# Patient Record
Sex: Male | Born: 1970 | Race: White | Hispanic: No | Marital: Married | Smoking: Former smoker
Health system: Southern US, Community
[De-identification: ages and names within clinical notes are randomized; demographics above are authoritative.]

## PROBLEM LIST (undated history)

## (undated) DIAGNOSIS — R569 Unspecified convulsions: Secondary | ICD-10-CM

## (undated) DIAGNOSIS — I10 Essential (primary) hypertension: Secondary | ICD-10-CM

## (undated) HISTORY — PX: HAND SURGERY: SHX662

---

## 2018-12-09 ENCOUNTER — Encounter: Payer: Self-pay | Admitting: Neurology

## 2018-12-17 ENCOUNTER — Encounter: Payer: Self-pay | Admitting: Neurology

## 2018-12-24 ENCOUNTER — Ambulatory Visit: Payer: Self-pay | Admitting: Neurology

## 2018-12-29 NOTE — Progress Notes (Deleted)
Gwenette Greet was seen today in neurologic consultation at the request of Everlena Cooper, MD.  The consultation is for the evaluation of possible dystonia.  Multiple records have been reviewed.  Patient was seen in Dr. Logan Bores clinic on November 13, 2018 due to concern of possible seizure.  The event, however, occurred in November, 2019.  He apparently slumped over his desk at work and reports that he was apneic and pulseless.  Reports that the event only lasted for a few seconds, but states that he can remember being placed in the EMS and also remembers having tremor throughout his entire body.  Since that event, he states that he has been having tightening of the muscles in the right upper extremity, for which he was referred here for possible dystonia.  He has had similar events in the past.  The events can last minutes to hours in duration.  After the event, the patient feels tired.  The patient admits that he was manipulating his Xanax and alcohol use prior to the November event.  Dr. Logan Bores did a routine EEG in his office that same day and reported that the patient had right arm jerking and dystonic posturing with wrist and right arm flexion that did not correlate with EEG change.  Patient had an MRI of the brain with and without gadolinium on December 01, 2018.  He did not bring the films.  I do have the report.  This was reported to show multiple, small T2 hyperintensities.  It was otherwise unremarkable.  MRI of the cervical spine was done the same day and reported to show moderate spondylosis with degenerative endplate changes and edema.  There was multilevel disc bulging.  There was moderate spinal stenosis, most prominent at C6-C7.  There is moderate to severe neural foraminal stenosis at C6-C7.  Neuroimaging has *** previously been performed.  It *** available for my review today.  PREVIOUS MEDICATIONS: ***  ALLERGIES:  Allergies not on file  CURRENT MEDICATIONS:  Outpatient Encounter  Medications as of 12/30/2018  Medication Sig  . ALPRAZolam (XANAX) 0.5 MG tablet Take 0.5 mg by mouth as needed for anxiety.  . baclofen (LIORESAL) 10 MG tablet Take 10 mg by mouth 3 (three) times daily.  Marland Kitchen lisinopril-hydrochlorothiazide (PRINZIDE,ZESTORETIC) 10-12.5 MG tablet Take 1 tablet by mouth daily.  Marland Kitchen PARoxetine (PAXIL) 20 MG tablet Take 20 mg by mouth daily.   No facility-administered encounter medications on file as of 12/30/2018.     PAST MEDICAL HISTORY:  No past medical history on file.  PAST SURGICAL HISTORY:  *** The histories are not reviewed yet. Please review them in the "History" navigator section and refresh this SmartLink.  SOCIAL HISTORY:   Social History   Socioeconomic History  . Marital status: Not on file    Spouse name: Not on file  . Number of children: Not on file  . Years of education: Not on file  . Highest education level: Not on file  Occupational History  . Not on file  Social Needs  . Financial resource strain: Not on file  . Food insecurity:    Worry: Not on file    Inability: Not on file  . Transportation needs:    Medical: Not on file    Non-medical: Not on file  Tobacco Use  . Smoking status: Not on file  Substance and Sexual Activity  . Alcohol use: Not on file  . Drug use: Not on file  . Sexual activity: Not on  file  Lifestyle  . Physical activity:    Days per week: Not on file    Minutes per session: Not on file  . Stress: Not on file  Relationships  . Social connections:    Talks on phone: Not on file    Gets together: Not on file    Attends religious service: Not on file    Active member of club or organization: Not on file    Attends meetings of clubs or organizations: Not on file    Relationship status: Not on file  . Intimate partner violence:    Fear of current or ex partner: Not on file    Emotionally abused: Not on file    Physically abused: Not on file    Forced sexual activity: Not on file  Other Topics Concern   . Not on file  Social History Narrative  . Not on file    FAMILY HISTORY:   No family status information on file.    ROS:  ROS  PHYSICAL EXAMINATION:    VITALS:  There were no vitals filed for this visit.  GEN:  Normal appears male in no acute distress.  Appears stated age. HEENT:  Normocephalic, atraumatic. The mucous membranes are moist. The superficial temporal arteries are without ropiness or tenderness. Cardiovascular: Regular rate and rhythm. Lungs: Clear to auscultation bilaterally. Neck/Heme: There are no carotid bruits noted bilaterally.  NEUROLOGICAL: Orientation:  The patient is alert and oriented x 3.  Fund of knowledge is appropriate.  Recent and remote memory intact.  Attention span and concentration normal.  Repeats and names without difficulty. Cranial nerves: There is good facial symmetry. The pupils are equal round and reactive to light bilaterally. Fundoscopic exam reveals clear disc margins bilaterally. Extraocular muscles are intact and visual fields are full to confrontational testing. Speech is fluent and clear. Soft palate rises symmetrically and there is no tongue deviation. Hearing is intact to conversational tone. Tone: Tone is good throughout. Sensation: Sensation is intact to light touch and pinprick throughout (facial, trunk, extremities). Vibration is intact at the bilateral big toe. There is no extinction with double simultaneous stimulation. There is no sensory dermatomal level identified. Coordination:  The patient has no difficulty with RAM's or FNF bilaterally. Motor: Strength is 5/5 in the bilateral upper and lower extremities.  Shoulder shrug is equal and symmetric. There is no pronator drift.  There are no fasciculations noted. DTR's: Deep tendon reflexes are 2/4 at the bilateral biceps, triceps, brachioradialis, patella and achilles.  Plantar responses are downgoing bilaterally. Gait and Station: The patient is able to ambulate without  difficulty. The patient is able to heel toe walk without any difficulty. The patient is able to ambulate in a tandem fashion. The patient is able to stand in the Romberg position.   IMPRESSION/PLAN  1. ***Abnormal movements  -It is my feeling that this is doubtful a primary dystonia, although I did not necessarily see this in the office today.  Dystonias can be set off by cervical lesions and he apparently does have moderate spinal canal stenosis at the C6-C7 level (I do not have films of this), so I would first recommend he have this evaluated by neurosurgery.   Cc:  No primary care provider on file.

## 2018-12-30 ENCOUNTER — Ambulatory Visit: Payer: Self-pay | Admitting: Neurology

## 2019-01-13 ENCOUNTER — Ambulatory Visit: Payer: Self-pay | Admitting: Neurology

## 2019-02-16 ENCOUNTER — Ambulatory Visit: Payer: Self-pay | Admitting: Neurology

## 2019-07-22 ENCOUNTER — Encounter (HOSPITAL_BASED_OUTPATIENT_CLINIC_OR_DEPARTMENT_OTHER): Payer: Self-pay | Admitting: Emergency Medicine

## 2019-07-22 ENCOUNTER — Other Ambulatory Visit: Payer: Self-pay

## 2019-07-22 ENCOUNTER — Emergency Department (HOSPITAL_BASED_OUTPATIENT_CLINIC_OR_DEPARTMENT_OTHER)
Admission: EM | Admit: 2019-07-22 | Discharge: 2019-07-22 | Disposition: A | Discharge status: Home / Self Care | Payer: 59 | Attending: Emergency Medicine | Admitting: Emergency Medicine

## 2019-07-22 ENCOUNTER — Emergency Department (HOSPITAL_BASED_OUTPATIENT_CLINIC_OR_DEPARTMENT_OTHER): Payer: 59

## 2019-07-22 DIAGNOSIS — R002 Palpitations: Secondary | ICD-10-CM | POA: Diagnosis not present

## 2019-07-22 DIAGNOSIS — R0789 Other chest pain: Secondary | ICD-10-CM

## 2019-07-22 DIAGNOSIS — R0602 Shortness of breath: Secondary | ICD-10-CM | POA: Diagnosis present

## 2019-07-22 DIAGNOSIS — I10 Essential (primary) hypertension: Secondary | ICD-10-CM | POA: Insufficient documentation

## 2019-07-22 DIAGNOSIS — Z79899 Other long term (current) drug therapy: Secondary | ICD-10-CM | POA: Insufficient documentation

## 2019-07-22 DIAGNOSIS — F17200 Nicotine dependence, unspecified, uncomplicated: Secondary | ICD-10-CM | POA: Diagnosis not present

## 2019-07-22 HISTORY — DX: Essential (primary) hypertension: I10

## 2019-07-22 LAB — BASIC METABOLIC PANEL
Anion gap: 11 (ref 5–15)
BUN: 17 mg/dL (ref 6–20)
CO2: 25 mmol/L (ref 22–32)
Calcium: 10 mg/dL (ref 8.9–10.3)
Chloride: 99 mmol/L (ref 98–111)
Creatinine, Ser: 0.83 mg/dL (ref 0.61–1.24)
GFR calc Af Amer: >60 mL/min (ref >60)
GFR calc non Af Amer: >60 mL/min (ref >60)
Glucose, Bld: 110 mg/dL — ABNORMAL HIGH (ref 70–99)
Potassium: 3.8 mmol/L (ref 3.5–5.1)
Sodium: 135 mmol/L (ref 135–145)

## 2019-07-22 LAB — CBC
HCT: 44.2 % (ref 39.0–52.0)
Hemoglobin: 14.8 g/dL (ref 13.0–17.0)
MCH: 31 pg (ref 26.0–34.0)
MCHC: 33.5 g/dL (ref 30.0–36.0)
MCV: 92.7 fL (ref 80.0–100.0)
Platelets: 301 10*3/uL (ref 150–400)
RBC: 4.77 MIL/uL (ref 4.22–5.81)
RDW: 11.2 % — ABNORMAL LOW (ref 11.5–15.5)
WBC: 6.9 10*3/uL (ref 4.0–10.5)
nRBC: 0 % (ref 0.0–0.2)

## 2019-07-22 LAB — TROPONIN I (HIGH SENSITIVITY)
Troponin I (High Sensitivity): <2 ng/L (ref <18)
Troponin I (High Sensitivity): <2 ng/L (ref <18)

## 2019-07-22 LAB — TSH: TSH: 1.032 u[IU]/mL (ref 0.350–4.500)

## 2019-07-22 MED ORDER — SODIUM CHLORIDE 0.9 % IV BOLUS
1000.0000 mL | Freq: Once | INTRAVENOUS | Status: AC
  Administered 2019-07-22: 14:00:00 1000 mL via INTRAVENOUS

## 2019-07-22 NOTE — ED Notes (Signed)
ED Provider at bedside discussing test results and dispo plan of care. 

## 2019-07-22 NOTE — ED Notes (Signed)
Patient transported to X-ray 

## 2019-07-22 NOTE — Discharge Instructions (Addendum)
You were seen in the emergency department today for chest pain, trouble breathing, & heart racing. Your work-up in the emergency department has been overall reassuring. Your labs have been fairly normal and or similar to previous blood work you have had done. Your EKG and the enzyme we use to check your heart did not show an acute heart attack at this time. Your chest x-ray was normal.   We would like you to follow up closely with your primary care provider within 1-3 days. Return to the ER immediately should you experience any new or worsening symptoms including but not limited to return of pain, worsened pain, vomiting, shortness of breath, dizziness, lightheadedness, passing out, or any other concerns that you may have.

## 2019-07-22 NOTE — ED Triage Notes (Signed)
Pt c/o central chest tightness, with bilateral shoulder pain, sob and dizziness that worsened today. Pt reports similar symptoms last week that resolved.

## 2019-07-22 NOTE — ED Notes (Signed)
Pt ambulated to bathroom 

## 2019-07-22 NOTE — ED Provider Notes (Signed)
[facility] EMERGENCY DEPARTMENT Provider Note   CSN: [id number] Arrival date & time: 07/22/19  1144     History   Chief Complaint Chief Complaint  Patient presents with  . Shortness of Breath    HPI Mitchell Lawson is a 48 y.o. male with a hx of tobacco abuse & HTN who presents to the ED w/ complaints of dyspnea, palpitations, & chest tightness which occurred @ 11:00 this AM and is relatively resolved at this time. Patient states he woke up in his relatively normal state of health other than that he did not sleep well last night.  He states that he was up moving around at work today and when he got back inside and sat down he began to feel lightheaded with palpitations like his heart was racing, short of breath, and his chest became mildly tight.  He did not necessarily think he was going to pass out.  He states the symptoms were very intense initially but have significantly calmed down and almost resolved at this point.  He cannot think of a specific trigger or alleviating or aggravating factor.  He had a similar milder briefer episode about a week ago that prompted him to make a primary care appointment for this upcoming week.  Symptoms did not seem to be exertional or pleuritic.  He had plenty to eat/drink this morning.  He had his typical coffee but no increase in caffeine.  He did recently stop chewing but is still using tobacco pouches to help with withdrawal.  Denies illicit drug use.  Denies headache, visual disturbance, numbness, tingling, weakness, syncope, dizziness like the room spinning, nausea, vomiting, diaphoresis, leg pain/swelling, hemoptysis, recent surgery/trauma, recent long travel, hormone use, personal hx of cancer, or hx of DVT/PE. Denies hx of CAD. Denies increased stress. He is able to walk several miles without difficulty on a regular bases.      HPI  Past Medical History:  Diagnosis Date  . Hypertension     There are no active problems to display for  this patient.   History reviewed. No pertinent surgical history.      Home Medications    Prior to Admission medications   Medication Sig Start Date End Date Taking? Authorizing Provider  ALPRAZolam Prudy Feeler) 0.5 MG tablet Take 0.5 mg by mouth as needed for anxiety.    [provider]  baclofen (LIORESAL) 10 MG tablet Take 10 mg by mouth 3 (three) times daily.    [provider]  lisinopril-hydrochlorothiazide (PRINZIDE,ZESTORETIC) 10-12.5 MG tablet Take 1 tablet by mouth daily.    [provider]  PARoxetine (PAXIL) 20 MG tablet Take 20 mg by mouth daily.    [provider]    Family History No family history on file.  Social History Social History   Tobacco Use  . Smoking status: Current Every Day Smoker  . Smokeless tobacco: Never Used  Substance Use Topics  . Alcohol use: Yes  . Drug use: Never     Allergies   Patient has no allergy information on record.   Review of Systems Review of Systems  Constitutional: Negative for chills, diaphoresis and fever.  Respiratory: Positive for chest tightness and shortness of breath. Negative for cough.   Cardiovascular: Positive for chest pain and palpitations. Negative for leg swelling.  Gastrointestinal: Negative for abdominal pain, nausea and vomiting.  Musculoskeletal: Negative for myalgias.  Neurological: Positive for light-headedness. Negative for dizziness, seizures, syncope, facial asymmetry, speech difficulty, weakness, numbness and headaches.  All other systems reviewed and are negative.  Physical Exam Updated Vital Signs BP (!) 126/102   Pulse 91   Temp 98.7 F (37.1 C) (Oral)   Resp 16   Ht 6\' 2"  (1.88 m)   Wt 88.5 kg   SpO2 100%   BMI 25.04 kg/m   Physical Exam Vitals signs and nursing note reviewed.  Constitutional:      General: He is not in acute distress.    Appearance: He is well-developed. He is not toxic-appearing.  HENT:     Head: Normocephalic and  atraumatic.  Eyes:     General:        Right eye: No discharge.        Left eye: No discharge.     Extraocular Movements: Extraocular movements intact.     Conjunctiva/sclera: Conjunctivae normal.     Pupils: Pupils are equal, round, and reactive to light.  Neck:     Musculoskeletal: Neck supple.  Cardiovascular:     Rate and Rhythm: Normal rate and regular rhythm.     Pulses:          Radial pulses are 2+ on the right side and 2+ on the left side.  Pulmonary:     Effort: Pulmonary effort is normal. No respiratory distress.     Breath sounds: Normal breath sounds. No wheezing, rhonchi or rales.  Abdominal:     General: There is no distension.     Palpations: Abdomen is soft.     Tenderness: There is no abdominal tenderness.  Musculoskeletal:     Right lower leg: He exhibits no tenderness. No edema.     Left lower leg: He exhibits no tenderness. No edema.  Skin:    General: Skin is warm and dry.     Findings: No rash.  Neurological:     General: No focal deficit present.     Mental Status: He is alert.     Comments: Clear speech.   Psychiatric:        Behavior: Behavior normal.    ED Treatments / Results  Labs (all labs ordered are listed, but only abnormal results are displayed) Labs Reviewed  BASIC METABOLIC PANEL - Abnormal; Notable for the following components:      Result Value   Glucose, Bld 110 (*)    All other components within normal limits  CBC - Abnormal; Notable for the following components:   RDW 11.2 (*)    All other components within normal limits  TROPONIN I (HIGH SENSITIVITY)    EKG EKG Interpretation  Date/Time:  Wednesday July 22 2019 11:54:42 EDT Ventricular Rate:  88 PR Interval:    QRS Duration: 101 QT Interval:  371 QTC Calculation: 449 R Axis:   71 Text Interpretation:  Sinus rhythm No prior ECG for comparison.  No STEMI Confirmed by Theda Belfastegeler, Chris ([id number]) on 07/22/2019 12:05:55 PM   Radiology Dg Chest 2 View  Result Date:  07/22/2019 CLINICAL DATA:  Chest pain EXAM: CHEST - 2 VIEW COMPARISON:  September 12, 2018 FINDINGS: The heart size and mediastinal contours are within normal limits. Both lungs are clear. The visualized skeletal structures are unremarkable. IMPRESSION: No active cardiopulmonary disease. Electronically Signed   By: Katherine Mantlehristopher  Green M.D.   On: 07/22/2019 12:19    Procedures Procedures (including critical care time)  Medications Ordered in ED Medications - No data to display   Initial Impression / Assessment and Plan / ED Course  I have reviewed the triage vital  signs and the nursing notes.  Pertinent labs & imaging results that were available during my care of the patient were reviewed by me and considered in my medical decision making (see chart for details).   Patient presents to the emergency department with complaints of episodic chest tightness, shortness of breath, lightheadedness, and palpitations, ultimately resolved at present, had a similar episode last week.  Nontoxic-appearing, no apparent distress, vitals without significant abnormality, mildly elevated diastolic blood pressure, doubt HTN emergency.  Exam is benign.  No focal neurologic deficits.  Patient placed on cardiac monitor, plan for basic labs, troponin, chest x-ray, and EKG.  CBC: No anemia or leukocytosis BMP: No significant electrolyte derangement. Troponin: Less than 2 x 2 w/ delta.  EKG: Sinus rhythm, no STEMI Chest x-ray: No active cardiopulmonary disease.  Work-up in the emergency department without underlying anemia, electrolyte derangement, arrhythmia on cardiac monitor/EKG, pneumothorax, or signs of fluid overload.  Low risk Wells, PERC negative, doubt PE.  Low risk heart pathway score, delta troponin without significant elevation or change, EKG without ischemic changes, doubt ACS.  TSH pending.  Unclear overall etiology patient symptoms, feel he should follow-up closely with his primary care provider, appears  appropriate for discharge home however with overall reassuring work-up. I discussed results, treatment plan, need for follow-up, and return precautions with the patient. Provided opportunity for questions, patient confirmed understanding and is in agreement with plan.    Final Clinical Impressions(s) / ED Diagnoses   Final diagnoses:  Palpitations  Chest discomfort  SOB (shortness of breath)    ED Discharge Orders    None       Amaryllis Dyke, PA-C 07/22/19 1533    Tegeler, Gwenyth Allegra, MD 07/22/19 [phone]

## 2020-10-17 IMAGING — CR DG CHEST 2V
2 series · 2 of 2 positions shown · non-contrast
Comparison: September 12, 2018

CLINICAL DATA: Chest pain

EXAM:
CHEST - 2 VIEW

[w chest lat]
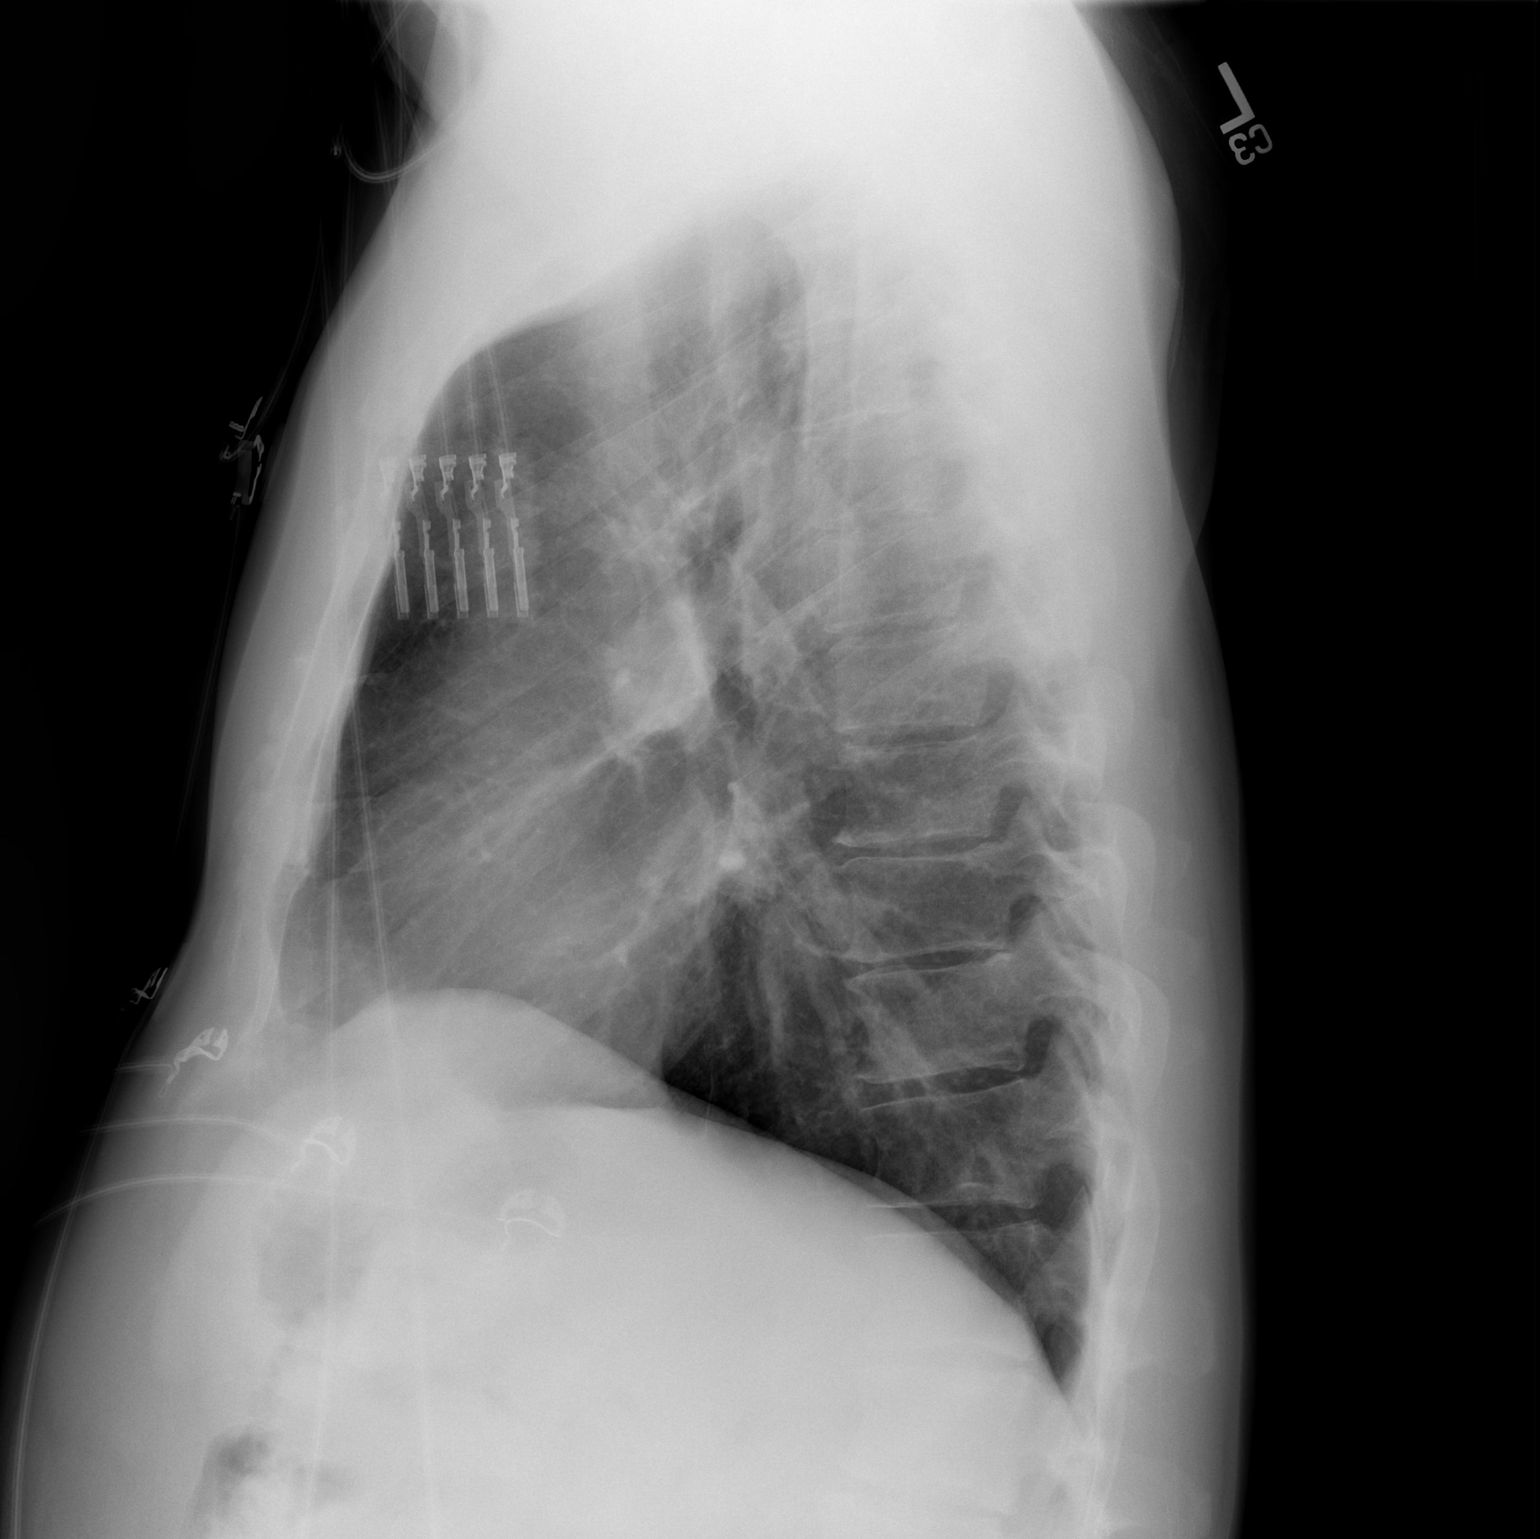

[w chest pa]
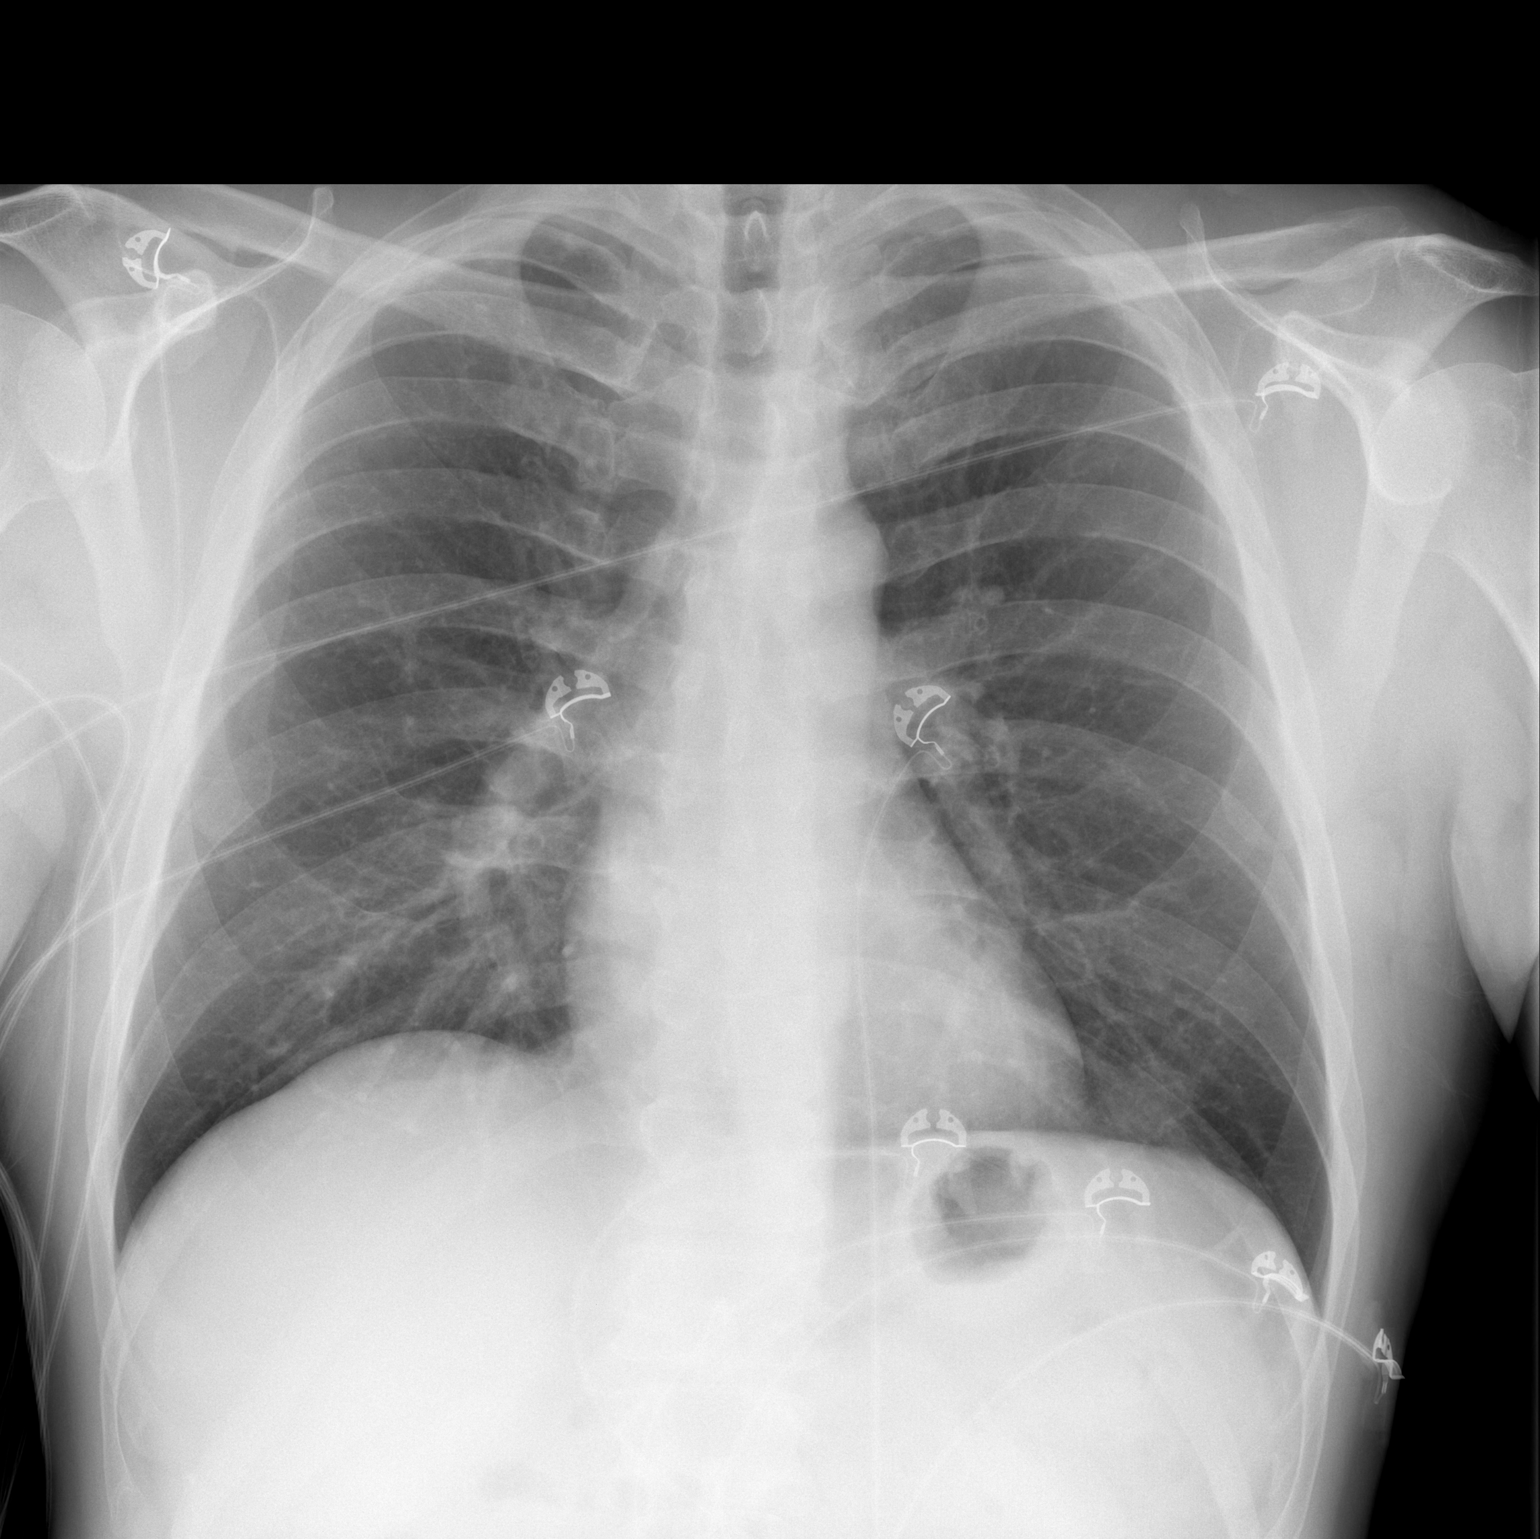

[2 of 2 positions shown; findings below may reference images not displayed]

FINDINGS: The heart size and mediastinal contours are within normal limits.
Both lungs are clear. The visualized skeletal structures are
unremarkable.
IMPRESSION: No active cardiopulmonary disease.

## 2024-04-14 ENCOUNTER — Emergency Department (HOSPITAL_BASED_OUTPATIENT_CLINIC_OR_DEPARTMENT_OTHER)
Admission: EM | Admit: 2024-04-14 | Discharge: 2024-04-14 | Disposition: A | Discharge status: Home / Self Care | Attending: Emergency Medicine | Admitting: Emergency Medicine

## 2024-04-14 ENCOUNTER — Encounter (HOSPITAL_BASED_OUTPATIENT_CLINIC_OR_DEPARTMENT_OTHER): Payer: Self-pay

## 2024-04-14 ENCOUNTER — Emergency Department (HOSPITAL_BASED_OUTPATIENT_CLINIC_OR_DEPARTMENT_OTHER)

## 2024-04-14 ENCOUNTER — Other Ambulatory Visit: Payer: Self-pay

## 2024-04-14 ENCOUNTER — Other Ambulatory Visit (HOSPITAL_BASED_OUTPATIENT_CLINIC_OR_DEPARTMENT_OTHER): Payer: Self-pay

## 2024-04-14 DIAGNOSIS — W540XXA Bitten by dog, initial encounter: Secondary | ICD-10-CM | POA: Diagnosis not present

## 2024-04-14 DIAGNOSIS — L03114 Cellulitis of left upper limb: Secondary | ICD-10-CM | POA: Insufficient documentation

## 2024-04-14 DIAGNOSIS — Z23 Encounter for immunization: Secondary | ICD-10-CM | POA: Diagnosis not present

## 2024-04-14 DIAGNOSIS — S61452A Open bite of left hand, initial encounter: Secondary | ICD-10-CM | POA: Insufficient documentation

## 2024-04-14 HISTORY — DX: Unspecified convulsions: R56.9

## 2024-04-14 MED ORDER — AMOXICILLIN-POT CLAVULANATE 875-125 MG PO TABS
1.0000 | ORAL_TABLET | Freq: Two times a day (BID) | ORAL | 0 refills | Status: AC
  Filled 2024-04-14: qty 14, 7d supply, fill #0

## 2024-04-14 MED ORDER — TETANUS-DIPHTH-ACELL PERTUSSIS 5-2.5-18.5 LF-MCG/0.5 IM SUSY
0.5000 mL | PREFILLED_SYRINGE | Freq: Once | INTRAMUSCULAR | Status: AC
  Administered 2024-04-14: 0.5 mL via INTRAMUSCULAR
  Filled 2024-04-14: qty 0.5

## 2024-04-14 NOTE — ED Provider Notes (Signed)
 [facility] [place] Provider Note   CSN: [id number] Arrival date & time: 04/14/24  9190     Patient presents with: No chief complaint on file.   Mitchell Lawson is a 53 y.o. male.  {Add pertinent medical, surgical, social history, OB history to HPI:6260} 53 year old male who is right-handed who presents to the emergency department with a dog bite to the left hand.  On Sunday he was trying to break up a fight between the family members dogs.  Says that one of the dogs bit him on his left hand.  Had some swelling but it worsened overnight and decided to come into the emergency department.  Also is having some redness of the dorsum of his hand.  Unsure of last tetanus shot.  Says that the dog is up-to-date on its rabies shots.  Says that family is going to watch it for any concerning symptoms.       Prior to Admission medications   Medication Sig Start Date End Date Taking? Authorizing Provider  ALPRAZolam (XANAX) 0.5 MG tablet Take 0.5 mg by mouth as needed for anxiety.    [provider]  baclofen (LIORESAL) 10 MG tablet Take 10 mg by mouth 3 (three) times daily.    [provider]  lisinopril-hydrochlorothiazide (PRINZIDE,ZESTORETIC) 10-12.5 MG tablet Take 1 tablet by mouth daily.    [provider]  PARoxetine (PAXIL) 20 MG tablet Take 20 mg by mouth daily.    [provider]    Allergies: Patient has no allergy information on record.    Review of Systems  Updated Vital Signs There were no vitals taken for this visit.  Physical Exam  Musculoskeletal:     Comments: Left radial pulse 2+.  Erythema and swelling to the dorsum of the left hand that extends from the MCPs to approximately the wrist.  No erythema streaking up the forearm or past there.  No fluctuance palpated.  2 cm healing laceration.  No drainage or purulence coming from the wound.  Full range of motion of all joints of the left hand and  wrist.     (all labs ordered are listed, but only abnormal results are displayed) Labs Reviewed - No data to display  EKG: None  Radiology: No results found.  {Document cardiac monitor, telemetry assessment procedure when appropriate:32947} Procedures   Medications Ordered in the ED - No data to display    {Click here for ABCD2, HEART and other calculators REFRESH Note before signing:1}                              Medical Decision Making Amount and/or Complexity of Data Reviewed Radiology: ordered.  Risk Prescription drug management.   53 year old male who presents emergency department with a dog bite to the left hand  Initial Ddx:  Dog bite, cellulitis, infection, fracture, rabies  MDM/Course:  Patient presents to the emergency department with dog bite to his left hand on Sunday.  Does have some cellulitis on the dorsum of his hand on exam.  No signs of flexor tenosynovitis or any other deeper space infections that would warrant hand surgery consultation.  Wound is already started to heal so was unable to irrigate it.  Tdap updated.  X-ray without fracture or retained foreign body.  Will start him on Augmentin at this point in time and have him follow-up with hand surgery if his symptoms do not improve.  Dog is vaccinated for rabies and family is watching it for any concerning symptoms.  Counseled him to follow-up if the dog starts experiencing any concerning symptoms for rabies.  This patient presents to the ED for concern of complaints listed in HPI, this involves an extensive number of treatment options, and is a complaint that carries with it a high risk of complications and morbidity. Disposition including potential need for admission considered.   Dispo: {Disposition:28069}  Additional history obtained from {Additional History:28067} Records reviewed {Records Reviewed:28068} The following labs were independently interpreted: {labs interpreted:28064} and show {lab  findings:28250} I independently reviewed the following imaging with scope of interpretation limited to determining acute life threatening conditions related to emergency care: {imaging interpreted:28065} and agree with the radiologist interpretation with the following exceptions: none I personally reviewed and interpreted cardiac monitoring: {cardiac monitoring:28251} I personally reviewed and interpreted the pt's EKG: see above for interpretation  I have reviewed the patients home medications and made adjustments as needed Consults: {Consultants:28063} Social Determinants of health:  ***  Portions of this note were generated with Scientist, clinical (histocompatibility and immunogenetics). Dictation errors may occur despite best attempts at proofreading.     Final diagnoses:  None    ED Discharge Orders     None

## 2024-04-14 NOTE — Discharge Instructions (Signed)
 You were seen for the dog bite of your hand in the emergency department.   At home, please take the antibiotics we prescribed you.  Take Tylenol and ibuprofen for your pain.    Check your MyChart online for the results of any tests that had not resulted by the time you left the emergency department.   Follow-up with the hand surgeons in 1 week if your symptoms are not improving.  Return immediately to the emergency department if you experience any of the following: Redness that continues to spread after 48 hours of the antibiotics, fever, severe pain, or any other concerning symptoms.    Thank you for visiting our Emergency Department. It was a pleasure taking care of you today.

## 2024-04-14 NOTE — ED Triage Notes (Signed)
 Bit by family members dog when trying to break up a fight Sunday. Dog UTD on vaccines. States Monday started having redness and swelling, spreading this morning. Unknown last tetanus.
